# Patient Record
Sex: Male | Born: 1975 | Race: White | Hispanic: No | Marital: Married | Smoking: Never smoker
Health system: Southern US, Community
[De-identification: ages and names within clinical notes are randomized; demographics above are authoritative.]

## PROBLEM LIST (undated history)

## (undated) DIAGNOSIS — I10 Essential (primary) hypertension: Secondary | ICD-10-CM

## (undated) HISTORY — PX: VASECTOMY: SHX75

---

## 2004-08-26 ENCOUNTER — Ambulatory Visit: Payer: Self-pay | Admitting: Family Medicine

## 2004-08-31 ENCOUNTER — Ambulatory Visit: Payer: Self-pay | Admitting: Family Medicine

## 2005-03-02 ENCOUNTER — Ambulatory Visit: Payer: Self-pay | Admitting: Family Medicine

## 2006-06-11 ENCOUNTER — Emergency Department: Payer: Self-pay | Admitting: Emergency Medicine

## 2006-10-20 ENCOUNTER — Ambulatory Visit: Payer: Self-pay | Admitting: Family Medicine

## 2006-12-08 DIAGNOSIS — R61 Generalized hyperhidrosis: Secondary | ICD-10-CM

## 2006-12-08 DIAGNOSIS — L708 Other acne: Secondary | ICD-10-CM

## 2006-12-11 ENCOUNTER — Ambulatory Visit: Payer: Self-pay | Admitting: Family Medicine

## 2006-12-13 ENCOUNTER — Telehealth (INDEPENDENT_AMBULATORY_CARE_PROVIDER_SITE_OTHER): Payer: Self-pay | Admitting: Internal Medicine

## 2007-01-01 ENCOUNTER — Encounter (INDEPENDENT_AMBULATORY_CARE_PROVIDER_SITE_OTHER): Payer: Self-pay | Admitting: Internal Medicine

## 2008-01-17 ENCOUNTER — Ambulatory Visit: Payer: Self-pay | Admitting: Family Medicine

## 2009-01-13 ENCOUNTER — Emergency Department: Payer: Self-pay | Admitting: Emergency Medicine

## 2009-01-16 ENCOUNTER — Ambulatory Visit: Payer: Self-pay | Admitting: Family Medicine

## 2009-02-02 ENCOUNTER — Telehealth (INDEPENDENT_AMBULATORY_CARE_PROVIDER_SITE_OTHER): Payer: Self-pay | Admitting: Internal Medicine

## 2009-03-04 ENCOUNTER — Ambulatory Visit (HOSPITAL_COMMUNITY): Admission: RE | Admit: 2009-03-04 | Discharge: 2009-03-04 | Payer: Self-pay | Admitting: Family Medicine

## 2009-03-04 ENCOUNTER — Ambulatory Visit: Payer: Self-pay | Admitting: Family Medicine

## 2009-03-04 DIAGNOSIS — IMO0002 Reserved for concepts with insufficient information to code with codable children: Secondary | ICD-10-CM

## 2009-03-05 ENCOUNTER — Telehealth (INDEPENDENT_AMBULATORY_CARE_PROVIDER_SITE_OTHER): Payer: Self-pay | Admitting: Internal Medicine

## 2009-10-10 ENCOUNTER — Emergency Department: Payer: Self-pay | Admitting: Emergency Medicine

## 2012-03-31 ENCOUNTER — Emergency Department: Payer: Self-pay | Admitting: Emergency Medicine

## 2012-05-28 ENCOUNTER — Ambulatory Visit: Payer: Self-pay | Admitting: Family Medicine

## 2012-12-07 ENCOUNTER — Emergency Department: Payer: Self-pay | Admitting: Emergency Medicine

## 2015-03-05 ENCOUNTER — Other Ambulatory Visit: Payer: Self-pay | Admitting: Family Medicine

## 2015-03-05 ENCOUNTER — Ambulatory Visit
Admission: RE | Admit: 2015-03-05 | Discharge: 2015-03-05 | Disposition: A | Discharge status: Home / Self Care | Payer: Medicaid Other | Source: Ambulatory Visit | Attending: Family Medicine | Admitting: Family Medicine

## 2015-03-05 DIAGNOSIS — M47816 Spondylosis without myelopathy or radiculopathy, lumbar region: Secondary | ICD-10-CM | POA: Diagnosis not present

## 2015-03-05 DIAGNOSIS — M549 Dorsalgia, unspecified: Secondary | ICD-10-CM

## 2016-04-10 IMAGING — CR DG LUMBAR SPINE COMPLETE 4+V
5 series · 5 of 5 positions shown · non-contrast
Comparison: 03/04/2009.

CLINICAL DATA: Injury.  Pain.

EXAM:
LUMBAR SPINE - COMPLETE 4+ VIEW

[l-spine ap]
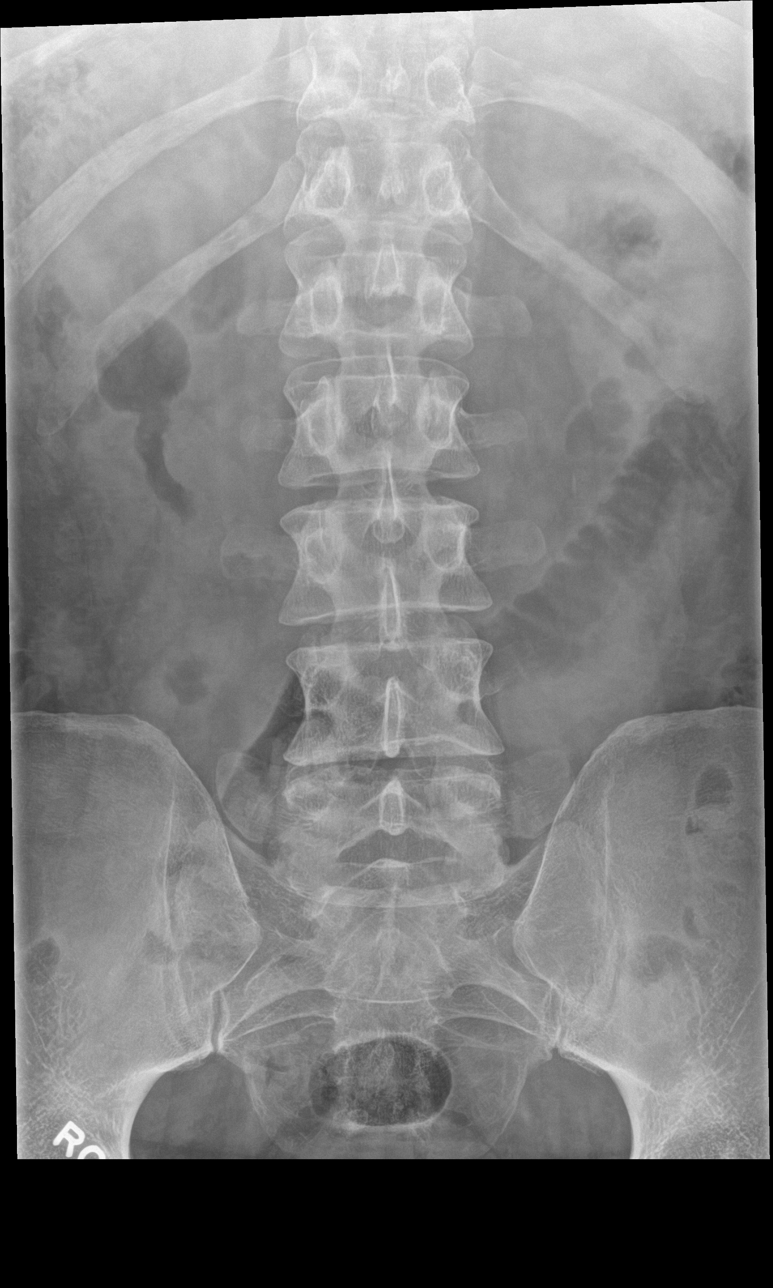

[l-spine obl (1 of 2)]
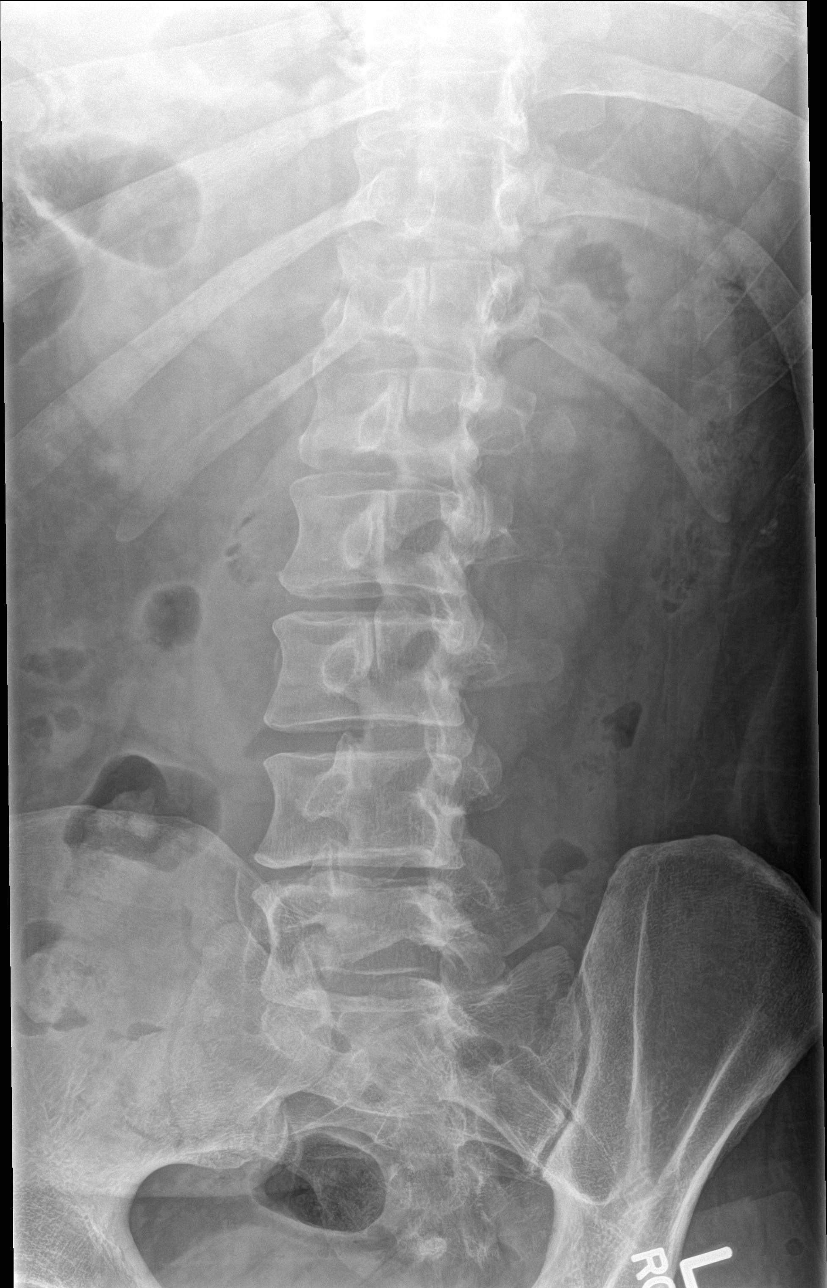

[l-spine obl (2 of 2)]
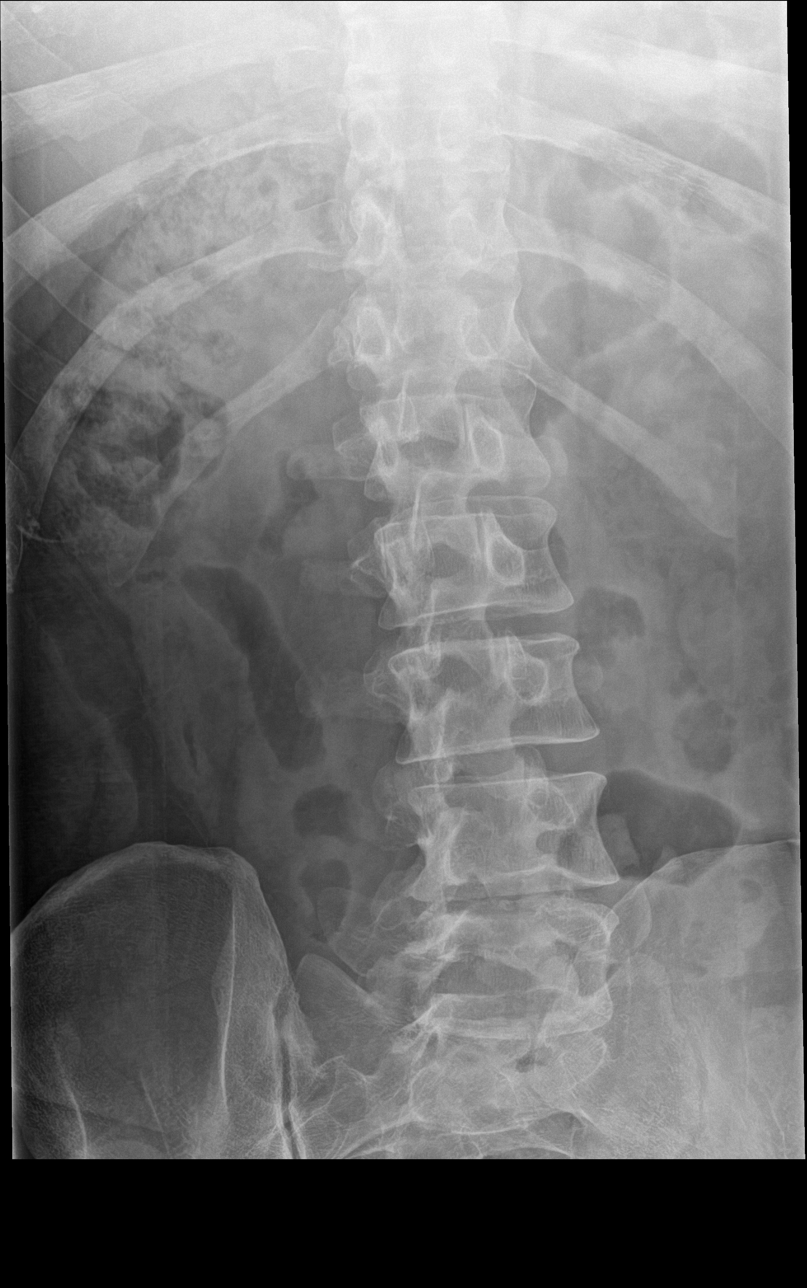

[l-spine lat]
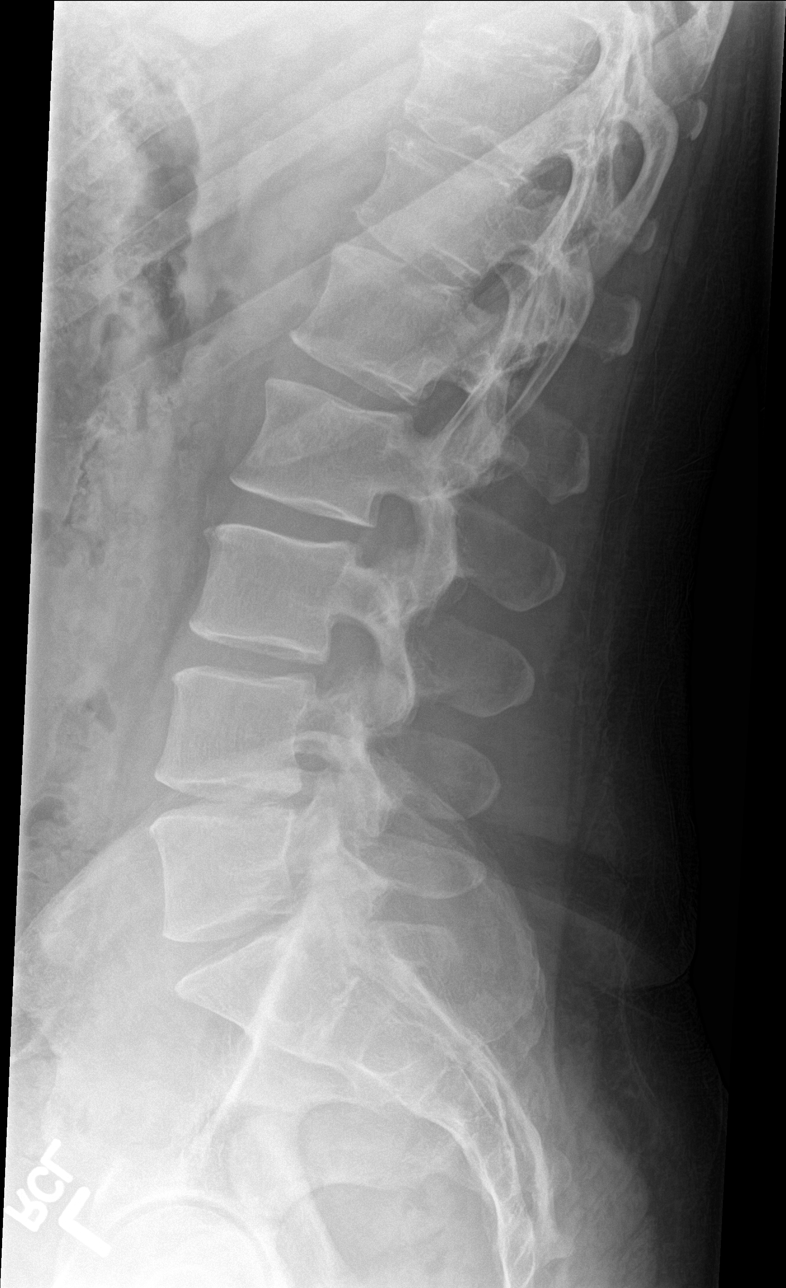

[l-spine spot]
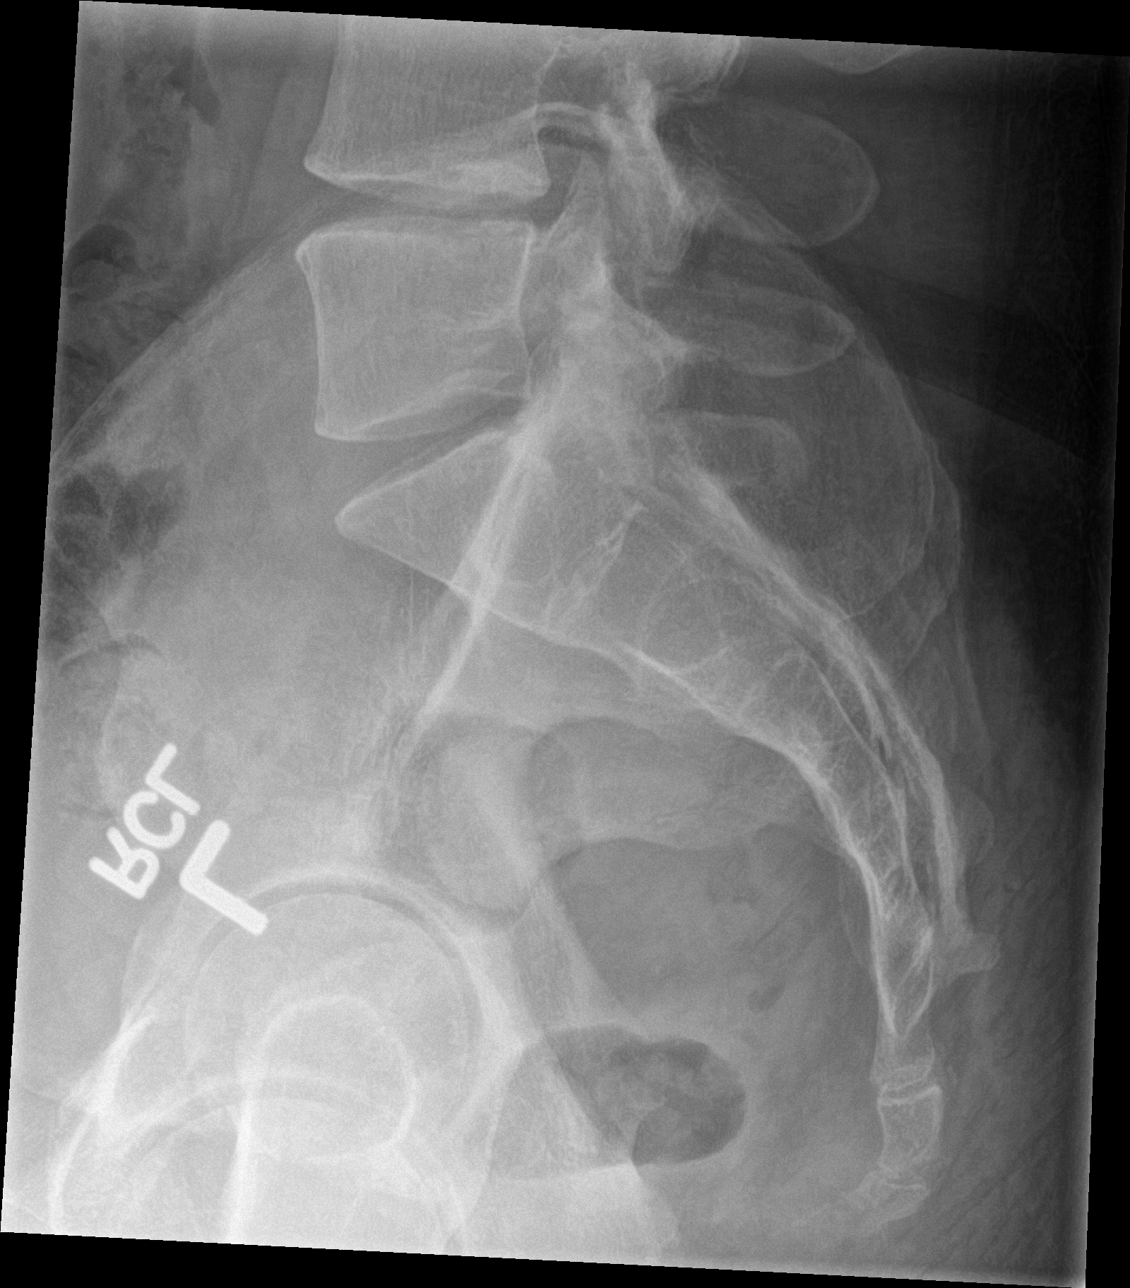

[5 of 5 positions shown; findings below may reference images not displayed]

FINDINGS: Paraspinal soft tissues normal. Diffuse degenerative change. Minimal
compression of T11 and T12 noted. These are stable. No acute
abnormality identified .
IMPRESSION: Mild diffuse degenerative change. Minimal old T11 and T12
compressions. No acute abnormality.No interim change from prior
exam.

## 2017-03-12 DIAGNOSIS — Y92009 Unspecified place in unspecified non-institutional (private) residence as the place of occurrence of the external cause: Secondary | ICD-10-CM | POA: Diagnosis not present

## 2017-03-12 DIAGNOSIS — S61411A Laceration without foreign body of right hand, initial encounter: Secondary | ICD-10-CM | POA: Insufficient documentation

## 2017-03-12 DIAGNOSIS — W268XXA Contact with other sharp object(s), not elsewhere classified, initial encounter: Secondary | ICD-10-CM | POA: Insufficient documentation

## 2017-03-12 DIAGNOSIS — I10 Essential (primary) hypertension: Secondary | ICD-10-CM | POA: Insufficient documentation

## 2017-03-12 DIAGNOSIS — Y93E9 Activity, other interior property and clothing maintenance: Secondary | ICD-10-CM | POA: Insufficient documentation

## 2017-03-12 DIAGNOSIS — Z23 Encounter for immunization: Secondary | ICD-10-CM | POA: Insufficient documentation

## 2017-03-12 DIAGNOSIS — Y999 Unspecified external cause status: Secondary | ICD-10-CM | POA: Insufficient documentation

## 2017-03-12 NOTE — ED Triage Notes (Signed)
Pt reports working on his flooring and hit his rt hand on a screw, pt has a linear lac to the palm of his hand,no bleeding at this time, bandaged in triage

## 2017-03-13 ENCOUNTER — Encounter: Payer: Self-pay | Admitting: Emergency Medicine

## 2017-03-13 ENCOUNTER — Emergency Department
Admission: EM | Admit: 2017-03-13 | Discharge: 2017-03-13 | Disposition: A | Discharge status: Home / Self Care | Payer: BLUE CROSS/BLUE SHIELD | Attending: Emergency Medicine | Admitting: Emergency Medicine

## 2017-03-13 DIAGNOSIS — S61411A Laceration without foreign body of right hand, initial encounter: Secondary | ICD-10-CM

## 2017-03-13 HISTORY — DX: Essential (primary) hypertension: I10

## 2017-03-13 MED ORDER — TETANUS-DIPHTH-ACELL PERTUSSIS 5-2.5-18.5 LF-MCG/0.5 IM SUSP
0.5000 mL | Freq: Once | INTRAMUSCULAR | Status: AC
  Administered 2017-03-13: 0.5 mL via INTRAMUSCULAR
  Filled 2017-03-13: qty 0.5

## 2017-03-13 NOTE — ED Provider Notes (Signed)
[facility] Emergency Department Provider Note   ____________________________________________   First MD Initiated Contact with Patient 03/13/17 0201     (approximate)  I have reviewed the triage vital signs and the nursing notes.   HISTORY  Chief Complaint Laceration    HPI Timothy Tanner is a 41 y.o. male who comes into the hospital today with a laceration to his right hand. The patient reports there was a screw sticking up through a board. Since it's been raining the board was slippery and the patient's hand slipped and got cut by the screw. The patient is unsure of his last tetanus shot. He rates his pain a 5 out of 10 in intensity. The patient reports that it was very dirty so he decided to come into the hospital to have the wound evaluated and repaired. The patient has no other complaints at this time.   Past Medical History:  Diagnosis Date  . Hypertension     Patient Active Problem List   Diagnosis Date Noted  . BACK PAIN, LUMBAR, WITH RADICULOPATHY 03/04/2009  . 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 HYPERHIDROSIS 12/08/2006    Past Surgical History:  Procedure Laterality Date  . VASECTOMY      Prior to Admission medications   Not on File    Allergies Patient has no known allergies.  No family history on file.  Social History Social History  Substance Use Topics  . Smoking status: Never Smoker  . Smokeless tobacco: Not on file  . Alcohol use No    Review of Systems  Constitutional: No fever/chills Eyes: No visual changes. ENT: No sore throat. Cardiovascular: Denies chest pain. Respiratory: Denies shortness of breath. Gastrointestinal: No abdominal pain.  No nausea, no vomiting.  No diarrhea.  No constipation. Genitourinary: Negative for dysuria. Musculoskeletal: Negative for back pain. Skin: hand laceration Neurological: Negative for headaches, focal weakness or  numbness.   ____________________________________________   PHYSICAL EXAM:  VITAL SIGNS: ED Triage Vitals  Enc Vitals Group     BP 03/12/17 2305 (!) 133/91     Pulse Rate 03/12/17 2305 93     Resp 03/12/17 2305 18     Temp 03/12/17 2305 99 F (37.2 C)     Temp Source 03/12/17 2305 Oral     SpO2 03/12/17 2305 100 %     Weight 03/12/17 2306 240 lb (108.9 kg)     Height 03/12/17 2306  (1.778 m)     Head Circumference --      Peak Flow --      Pain Score 03/12/17 2305 3     Pain Loc --      Pain Edu? --      Excl. in GC? --     Constitutional: Alert and oriented. Well appearing and in mild distress. Eyes: Conjunctivae are normal. PERRL. EOMI. Head: Atraumatic. Nose: No congestion/rhinnorhea. Mouth/Throat: Mucous membranes are moist.  Oropharynx non-erythematous. Cardiovascular: Normal rate, regular rhythm. Grossly normal heart sounds.  Good peripheral circulation. Respiratory: Normal respiratory effort.  No retractions. Lungs CTAB. Gastrointestinal: Soft and nontender. No distention.  Musculoskeletal: No lower extremity tenderness nor edema.   Neurologic:  Normal speech and language.  Skin:  Skin is warm, dry superficial laceration to right hand palmar surface, hypothenar eminence Psychiatric: Mood and affect are normal.   ____________________________________________   LABS (all labs ordered are listed, but only abnormal results are displayed)  Labs Reviewed - No data to display ____________________________________________  EKG  none ____________________________________________  RADIOLOGY  No results found.  ____________________________________________   PROCEDURES  Procedure(s) performed: please, see procedure note(s).  Marland Kitchen.Laceration Repair Date/Time: 03/13/2017 2:56 AM Performed by: Rebecka Apley Authorized by: Rebecka Apley   Consent:    Consent obtained:  Verbal   Consent given by:  Patient Laceration details:    Location:  Hand    Hand location:  R palm   Length (cm):  4 Repair type:    Repair type:  Simple Treatment:    Area cleansed with:  Betadine   Amount of cleaning:  Standard Skin repair:    Repair method:  Tissue adhesive Post-procedure details:    Dressing:  Open (no dressing)   Patient tolerance of procedure:  Tolerated well, no immediate complications    Critical Care performed: No  ____________________________________________   INITIAL IMPRESSION / ASSESSMENT AND PLAN / ED COURSE  Pertinent labs & imaging results that were available during my care of the patient were reviewed by me and considered in my medical decision making (see chart for details).  this is a 41 year old male who comes into the hospital today with a superficial laceration to his hand. The patient was cut with a screw and he does not recall his last tetanus shot so we did give him a DTaP. We cleaned out the wound as the patient was covered in sawdust and although it does not open I did feel it with some Dermabond. The patient will be discharged home to follow-up with his primary care physician. He should return with any redness, swelling, drainage or any other concerns.      ____________________________________________   FINAL CLINICAL IMPRESSION(S) / ED DIAGNOSES  Final diagnoses:  Laceration of right hand without foreign body, initial encounter      NEW MEDICATIONS STARTED DURING THIS VISIT:  There are no discharge medications for this patient.    Note:  This document was prepared using Dragon voice recognition software and may include unintentional dictation errors.    Rebecka Apley, MD 03/13/17 [phone]

## 2021-09-02 DIAGNOSIS — Z0000 Encounter for general adult medical examination without abnormal findings: Secondary | ICD-10-CM | POA: Diagnosis not present

## 2021-09-02 DIAGNOSIS — I10 Essential (primary) hypertension: Secondary | ICD-10-CM | POA: Diagnosis not present

## 2021-09-02 DIAGNOSIS — Z125 Encounter for screening for malignant neoplasm of prostate: Secondary | ICD-10-CM | POA: Diagnosis not present

## 2021-09-02 DIAGNOSIS — Z0130 Encounter for examination of blood pressure without abnormal findings: Secondary | ICD-10-CM | POA: Diagnosis not present

## 2021-09-02 DIAGNOSIS — Z1159 Encounter for screening for other viral diseases: Secondary | ICD-10-CM | POA: Diagnosis not present

## 2021-09-02 DIAGNOSIS — Z1389 Encounter for screening for other disorder: Secondary | ICD-10-CM | POA: Diagnosis not present

## 2021-09-07 DIAGNOSIS — Z1211 Encounter for screening for malignant neoplasm of colon: Secondary | ICD-10-CM | POA: Diagnosis not present

## 2023-08-31 ENCOUNTER — Other Ambulatory Visit: Payer: Self-pay | Admitting: Family Medicine

## 2023-08-31 DIAGNOSIS — R0600 Dyspnea, unspecified: Secondary | ICD-10-CM

## 2023-11-02 ENCOUNTER — Encounter: Payer: Self-pay | Admitting: Urology

## 2023-11-02 ENCOUNTER — Ambulatory Visit: Admitting: Urology

## 2023-11-02 VITALS — BP 133/80 | HR 72 | Ht 70.0 in | Wt 230.0 lb

## 2023-11-02 DIAGNOSIS — R3129 Other microscopic hematuria: Secondary | ICD-10-CM

## 2023-11-02 LAB — URINALYSIS, COMPLETE
Bilirubin, UA: NEGATIVE
Glucose, UA: NEGATIVE
Ketones, UA: NEGATIVE
Leukocytes,UA: NEGATIVE
Nitrite, UA: NEGATIVE
Protein,UA: NEGATIVE
Specific Gravity, UA: 1.025 (ref 1.005–1.030)
Urobilinogen, Ur: 0.2 mg/dL (ref 0.2–1.0)
pH, UA: 6 (ref 5.0–7.5)

## 2023-11-02 LAB — MICROSCOPIC EXAMINATION

## 2023-11-02 NOTE — Progress Notes (Signed)
    I, Timothy Tanner, acting as a scribe for Geraline Knapp, MD., have documented all relevant documentation on the behalf of Geraline Knapp, MD, as directed by Geraline Knapp, MD while in the presence of Geraline Knapp, MD.  11/02/2023 3:10 PM   Timothy Tanner 12-25-1975 [id number]  Referring provider: Macie Saxon, MD [address] [place],  [place] [address]  Chief Complaint  Patient presents with   Hematuria    HPI: Timothy Tanner is a 48 y.o. male referred for evaluation of hematuria.   Saw PCP March 2025 complaining of chest congestion and also noted having dark urine, which he described as tea colored. Urinalysis positive for blood on dipstick. It was treated with antibiotics and had a follow-up urinalysis, which showed 3+ positive blood on dipstick. The urine was sent for microscopic examination and showed 11-30 RBC/11-30 WBCs. He states he was treated with a second course of doxycycline and his urine has been clear since that time.  He was not having lower urinary tract symptoms and specifically denied dysuria, frequency, or urgency.  No prior history of urologic problems.  He is a non-smoker Denies previous tobacco history.    PMH: Past Medical History:  Diagnosis Date   Hypertension     Surgical History: Past Surgical History:  Procedure Laterality Date   VASECTOMY      Home Medications:  Allergies as of 11/02/2023   No Known Allergies      Medication List        Accurate as of Nov 02, 2023  3:10 PM. If you have any questions, ask your nurse or doctor.          lisinopril 10 MG tablet Commonly known as: ZESTRIL Take 10 mg by mouth daily.        Allergies: No Known Allergies  Social History:  reports that he has never smoked. He does not have any smokeless tobacco history on file. He reports that he does not drink alcohol and does not use drugs.   Physical Exam: BP 133/80   Pulse 72   Ht 5\' 10"  (1.778 m)   Wt 230 lb (104.3 kg)    BMI 33.00 kg/m   Constitutional:  Alert and oriented, No acute distress. HEENT: [place] AT Respiratory: Normal respiratory effort, no increased work of breathing. Psychiatric: Normal mood and affect.   Urinalysis Dipstick 1+ blood/microscopy 3-10 RBC    Assessment & Plan:    1. Microhematuria AUA hematuria risk stratification: intermediate based on age.  We discussed the recommended evaluation of a low intermediate risk hematuria, which includes a renal ultrasound and cystoscopy.  The procedures were discussed in detail, and he has elected to proceed with further evaluation.  TRUS order placed and cystoscopy scheduled.  I have reviewed the above documentation for accuracy and completeness, and I agree with the above.   Geraline Knapp, MD  [facility] [address], [address] [place], [place] [address] [phone]

## 2023-11-17 ENCOUNTER — Ambulatory Visit
Admission: RE | Admit: 2023-11-17 | Discharge: 2023-11-17 | Disposition: A | Discharge status: Home / Self Care | Source: Ambulatory Visit | Attending: Urology | Admitting: Urology

## 2023-11-17 DIAGNOSIS — R3129 Other microscopic hematuria: Secondary | ICD-10-CM | POA: Diagnosis present

## 2023-12-07 ENCOUNTER — Encounter: Payer: Self-pay | Admitting: Urology

## 2023-12-07 ENCOUNTER — Ambulatory Visit: Admitting: Urology

## 2023-12-07 VITALS — BP 142/81 | HR 68 | Ht 70.0 in | Wt 235.0 lb

## 2023-12-07 DIAGNOSIS — R3129 Other microscopic hematuria: Secondary | ICD-10-CM

## 2023-12-07 LAB — MICROSCOPIC EXAMINATION

## 2023-12-07 NOTE — Progress Notes (Signed)
   12/07/23  CC:  Chief Complaint  Patient presents with   Cysto    HPI: Refer to my prior note 11/02/2023.  Renal ultrasound showed no abnormalities.  UA today negative RBCs  Blood pressure (!) 142/81, pulse 68, height 5' 10 (1.778 m), weight 235 lb (106.6 kg).   Cystoscopy Procedure Note  Patient identification was confirmed, informed consent was obtained, and patient was prepped using Betadine solution.  Lidocaine jelly was administered per urethral meatus.     Pre-Procedure: - Inspection reveals a normal caliber urethral meatus.  Procedure: The flexible cystoscope was introduced without difficulty - No urethral strictures/lesions are present. - Mild lateral lobe enlargement prostate with hypervascularity - Mild elevation bladder neck - Bilateral ureteral orifices identified - Bladder mucosa  reveals no ulcers, tumors, or lesions - No bladder stones - No trabeculation  Retroflexion shows prominent hypervascularity bladder neck   Post-Procedure: - Patient tolerated the procedure well  Assessment/ Plan: Negative renal ultrasound No bladder mucosal lesions on cystoscopy Hypervascularity bladder neck/prostatic urethra 28-month follow-up with UA   Geraline Knapp, MD

## 2023-12-08 LAB — URINALYSIS, COMPLETE
Bilirubin, UA: NEGATIVE
Glucose, UA: NEGATIVE
Ketones, UA: NEGATIVE
Leukocytes,UA: NEGATIVE
Nitrite, UA: NEGATIVE
Protein,UA: NEGATIVE
Specific Gravity, UA: 1.01 (ref 1.005–1.030)
Urobilinogen, Ur: 0.2 mg/dL (ref 0.2–1.0)
pH, UA: 7 (ref 5.0–7.5)

## 2023-12-08 LAB — MICROSCOPIC EXAMINATION
Bacteria, UA: NONE SEEN
WBC, UA: NONE SEEN /HPF (ref 0–5)

## 2024-06-12 ENCOUNTER — Ambulatory Visit: Admitting: Urology
